# Patient Record
Sex: Female | Born: 1960 | Hispanic: No | Marital: Married | Smoking: Never smoker
Health system: Southern US, Community
[De-identification: ages and names within clinical notes are randomized; demographics above are authoritative.]

## PROBLEM LIST (undated history)

## (undated) HISTORY — PX: COLONOSCOPY: SHX174

## (undated) HISTORY — PX: EYE SURGERY: SHX253

---

## 2021-06-11 LAB — COLOGUARD: COLOGUARD: NEGATIVE

## 2022-01-16 ENCOUNTER — Ambulatory Visit (INDEPENDENT_AMBULATORY_CARE_PROVIDER_SITE_OTHER): Payer: BC Managed Care – PPO

## 2022-01-16 ENCOUNTER — Ambulatory Visit
Admission: EM | Admit: 2022-01-16 | Discharge: 2022-01-16 | Disposition: A | Discharge status: Home / Self Care | Payer: BC Managed Care – PPO

## 2022-01-16 ENCOUNTER — Other Ambulatory Visit: Payer: Self-pay

## 2022-01-16 DIAGNOSIS — S52502A Unspecified fracture of the lower end of left radius, initial encounter for closed fracture: Secondary | ICD-10-CM

## 2022-01-16 NOTE — ED Triage Notes (Signed)
Patient presents to Urgent Care with complaints falling on concrete and landing on her left wrist. Increase pain with movements.  ? ?Denies numbness.  ?

## 2022-01-16 NOTE — ED Provider Notes (Signed)
?[facility] ? ? ? ?CSN: [id number] ?Arrival date & time: 01/16/22  1901 ? ? ?  ? ?History   ?Chief Complaint ?Chief Complaint  ?Patient presents with  ? Fall  ? Wrist Injury  ? ? ?HPI ?Kaitlin Franklin is a 61 y.o. female presenting for left wrist pain and swelling following an accidental fall that happened today.  Patient says she slipped and fell on concrete, landing on her wrist.  Patient has swelling to the distal radial aspect of wrist.  She reports the area feels tingly.  Sometimes pain radiates up the forearm a little bit.  There is no associated bruising or lacerations/abrasions.  Patient is right-handed.  She denies any major injury of this extremity in the past.  She denies any other injury sustained in the accidental fall.  No other complaints. ? ?HPI ? ?History reviewed. No pertinent past medical history. ? ?There are no problems to display for this patient. ? ? ?Past Surgical History:  ?Procedure Laterality Date  ? COLONOSCOPY    ? EYE SURGERY    ? ? ?OB History   ?No obstetric history on file. ?  ? ? ? ?Home Medications   ? ?Prior to Admission medications   ?Medication Sig Start Date End Date Taking? Authorizing Provider  ?estradiol (ESTRACE) 0.1 MG/GM vaginal cream SMARTSIG:2 Gram(s) Vaginal Daily 10/06/21   [provider]  ? ? ?Family History ?History reviewed. No pertinent family history. ? ?Social History ?Social History  ? ?Tobacco Use  ? Smoking status: Never  ? Smokeless tobacco: Never  ?Vaping Use  ? Vaping Use: Never used  ?Substance Use Topics  ? Alcohol use: Yes  ? Drug use: Never  ? ? ? ?Allergies   ?Fentanyl, Benzalkonium chloride, Influenza vaccines, Mometasone, and Thimerosal ? ? ?Review of Systems ?Review of Systems  ?Musculoskeletal:  Positive for arthralgias and joint swelling.  ?Skin:  Negative for color change and wound.  ?Neurological:  Negative for weakness and numbness.  ? ? ?Physical Exam ?Triage Vital Signs ?ED Triage Vitals  ?Enc Vitals Group  ?   BP   ?    Pulse   ?   Resp   ?   Temp   ?   Temp src   ?   SpO2   ?   Weight   ?   Height   ?   Head Circumference   ?   Peak Flow   ?   Pain Score   ?   Pain Loc   ?   Pain Edu?   ?   Excl. in GC?   ? ?No data found. ? ?Updated Vital Signs ?BP (!) 149/112 (BP Location: Right Arm)   Pulse 83   Resp 16   SpO2 99%  ?   ? ?Physical Exam ?Vitals and nursing note reviewed.  ?Constitutional:   ?   General: She is not in acute distress. ?   Appearance: Normal appearance. She is not ill-appearing or toxic-appearing.  ?HENT:  ?   Head: Normocephalic and atraumatic.  ?Eyes:  ?   General: No scleral icterus.    ?   Right eye: No discharge.     ?   Left eye: No discharge.  ?   Conjunctiva/sclera: Conjunctivae normal.  ?Cardiovascular:  ?   Rate and Rhythm: Normal rate and regular rhythm.  ?   Pulses: Normal pulses.  ?Pulmonary:  ?   Effort: Pulmonary effort is normal. No respiratory distress.  ?Musculoskeletal:  ?  Left wrist: Swelling (distal radius) and tenderness (distal radius) present. No deformity or snuff box tenderness. Decreased range of motion. Normal pulse.  ?   Cervical back: Neck supple.  ?Skin: ?   General: Skin is dry.  ?Neurological:  ?   General: No focal deficit present.  ?   Mental Status: She is alert. Mental status is at baseline.  ?   Motor: No weakness.  ?   Coordination: Coordination normal.  ?   Gait: Gait normal.  ?Psychiatric:     ?   Mood and Affect: Mood normal.     ?   Behavior: Behavior normal.     ?   Thought Content: Thought content normal.  ? ? ? ?UC Treatments / Results  ?Labs ?(all labs ordered are listed, but only abnormal results are displayed) ?Labs Reviewed - No data to display ? ?EKG ? ? ?Radiology ?DG Wrist Complete Left ? ?Result Date: 01/16/2022 ?CLINICAL DATA:  Recent fall with left wrist pain, initial encounter EXAM: LEFT WRIST - COMPLETE 3+ VIEW COMPARISON:  None. FINDINGS: There is an undisplaced fracture of the distal left radial metaphysis. Carpal bones are within normal limits. Distal  ulna is unremarkable. IMPRESSION: Undisplaced distal left radial fracture. Electronically Signed   By: Alcide Clever M.D.   On: 01/16/2022 19:40   ? ?Procedures ?Procedures (including critical care time) ? ?Medications Ordered in UC ?Medications - No data to display ? ?Initial Impression / Assessment and Plan / UC Course  ?I have reviewed the triage vital signs and the nursing notes. ? ?Pertinent labs & imaging results that were available during my care of the patient were reviewed by me and considered in my medical decision making (see chart for details). ? ?61 year old female presenting for left wrist pain and swelling following an accidental fall that occurred today.  On exam she does have swelling over the distal radius and tenderness in this area as well.  Reduced range of motion in all directions due to pain and swelling.  Good pulses and sensation.  X-ray of wrist obtained.  Distal radius fracture, nondisplaced seen.  Reviewed result with patient.  Patient placed in thumb spica wrist brace/splint.  Reviewed RICE guidelines and advised her not to use affected extremity until cleared by Ortho.  Patient given information for orthopedics to follow-up with in the next week. ? ? ?Final Clinical Impressions(s) / UC Diagnoses  ? ?Final diagnoses:  ?Closed fracture of distal end of left radius, unspecified fracture morphology, initial encounter  ? ? ? ?Discharge Instructions   ? ?  ?-You have fractured your distal radius. ?- We have placed you in a splint that you can remove but you should leave on for 99% of the time.  Treat this as a cast. ?- You can take it off to ice your wrist but you should sleep in it.  Ibuprofen or Tylenol for pain relief.  You should not be moving your wrist until orthopedics clears you. ?- Make an appointment with them for next week or go to the walk-in clinic. ? ?You have a condition requiring you to follow up with Orthopedics so please call one of the following office for appointment:   ? ?[facility] ?[address], [place], [place] [address] ?Phone: [phone] ? ?[facility] ?[address] [place], [place] [address] ?Phone: [phone]  ? ? ? ? ?ED Prescriptions   ?None ?  ? ?PDMP not reviewed this encounter. ?  ?Shirlee Latch, PA-C ?01/16/22  1953 ? ?

## 2022-01-16 NOTE — Discharge Instructions (Addendum)
-  You have fractured your distal radius. ?- We have placed you in a splint that you can remove but you should leave on for 99% of the time.  Treat this as a cast. ?- You can take it off to ice your wrist but you should sleep in it.  Ibuprofen or Tylenol for pain relief.  You should not be moving your wrist until orthopedics clears you. ?- Make an appointment with them for next week or go to the walk-in clinic. ? ?You have a condition requiring you to follow up with Orthopedics so please call one of the following office for appointment:  ? ?[facility] ?[address] [place], [place] [address] ?Phone: [phone] ? ?[facility] ?[address], [place], [place] [address] ?Phone: [phone]  ?

## 2023-03-12 IMAGING — CR DG WRIST COMPLETE 3+V*L*
4 series · 4 of 4 positions shown · non-contrast
Comparison: None.

CLINICAL DATA: Recent fall with left wrist pain, initial encounter

EXAM:
LEFT WRIST - COMPLETE 3+ VIEW

[wrist pa]
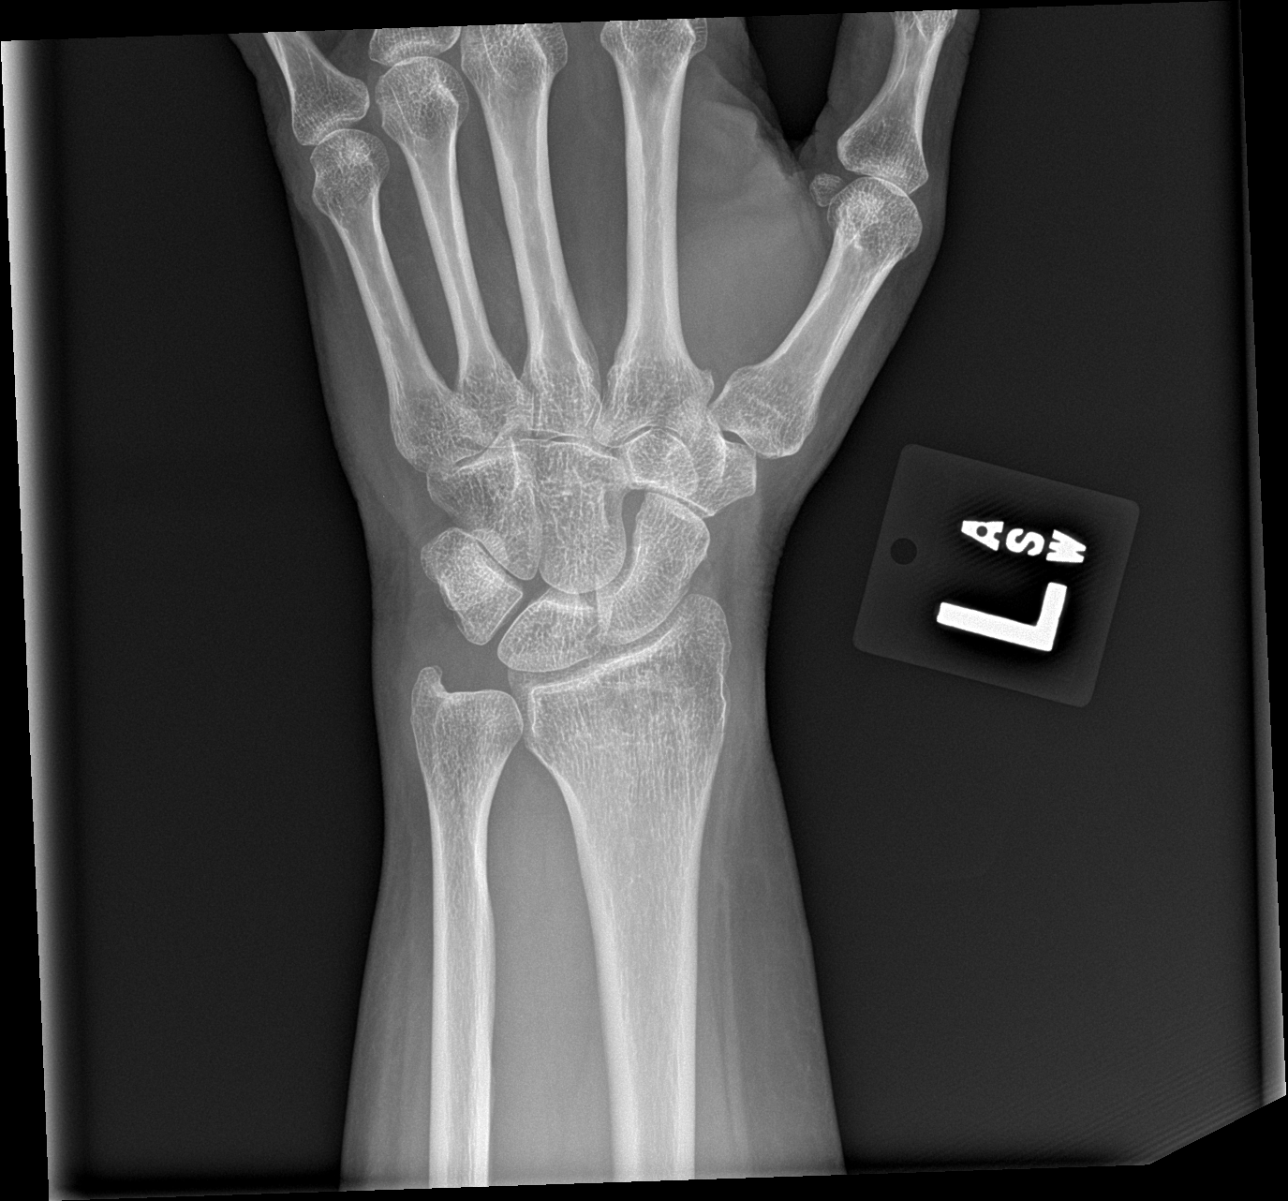

[wrist obl]
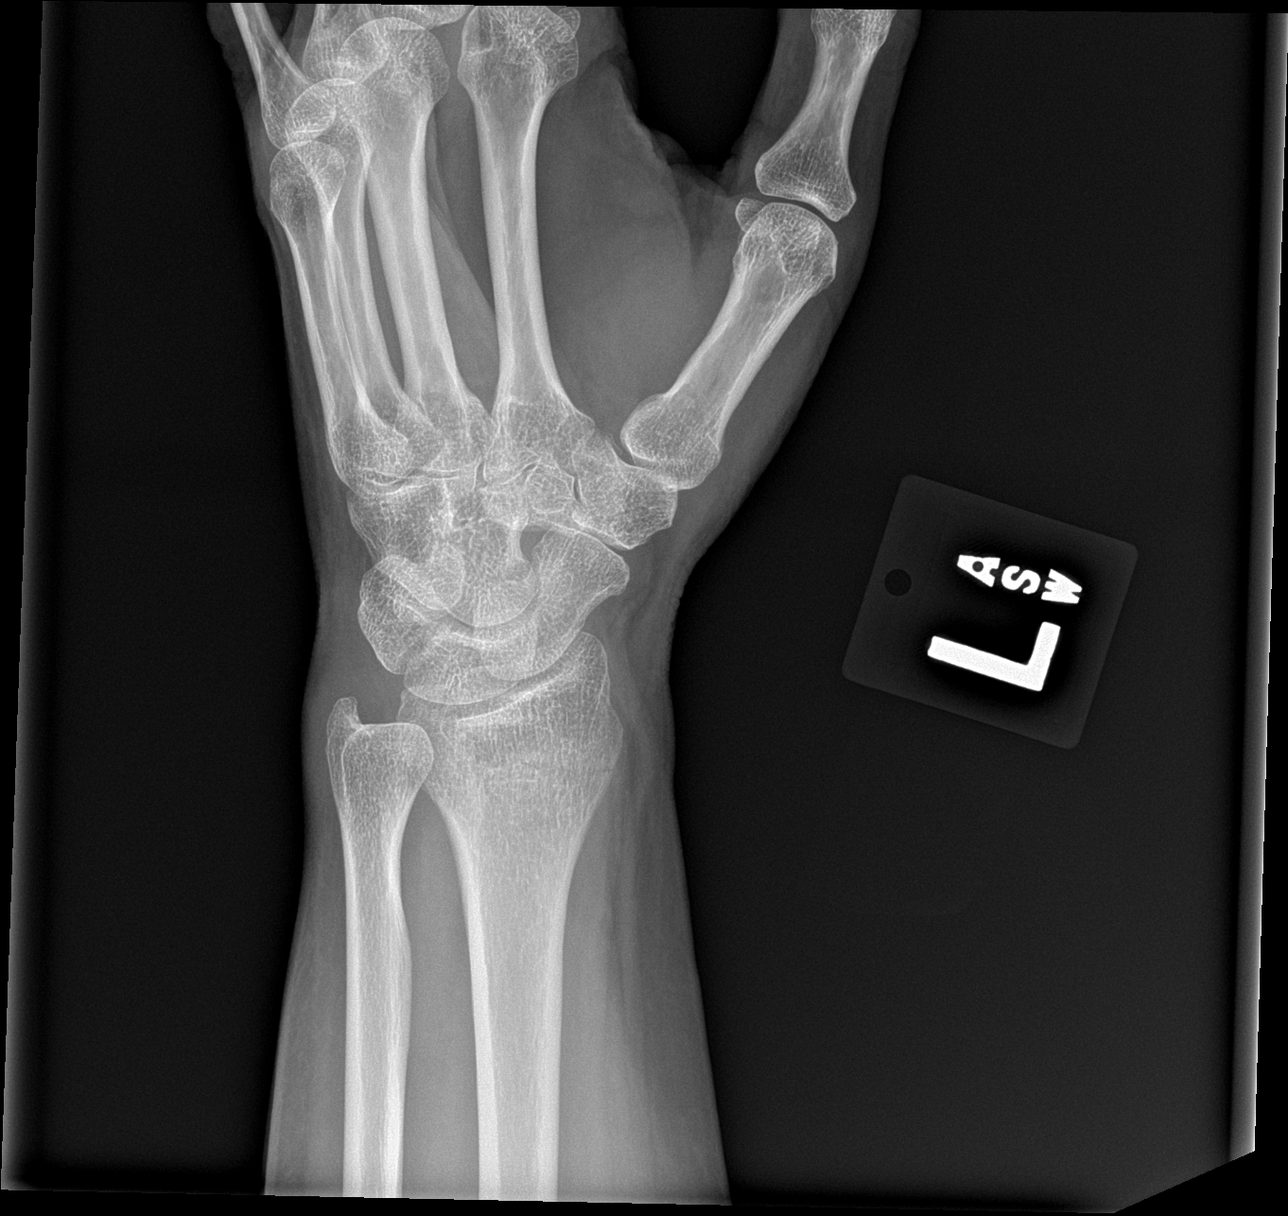

[wrist lat]
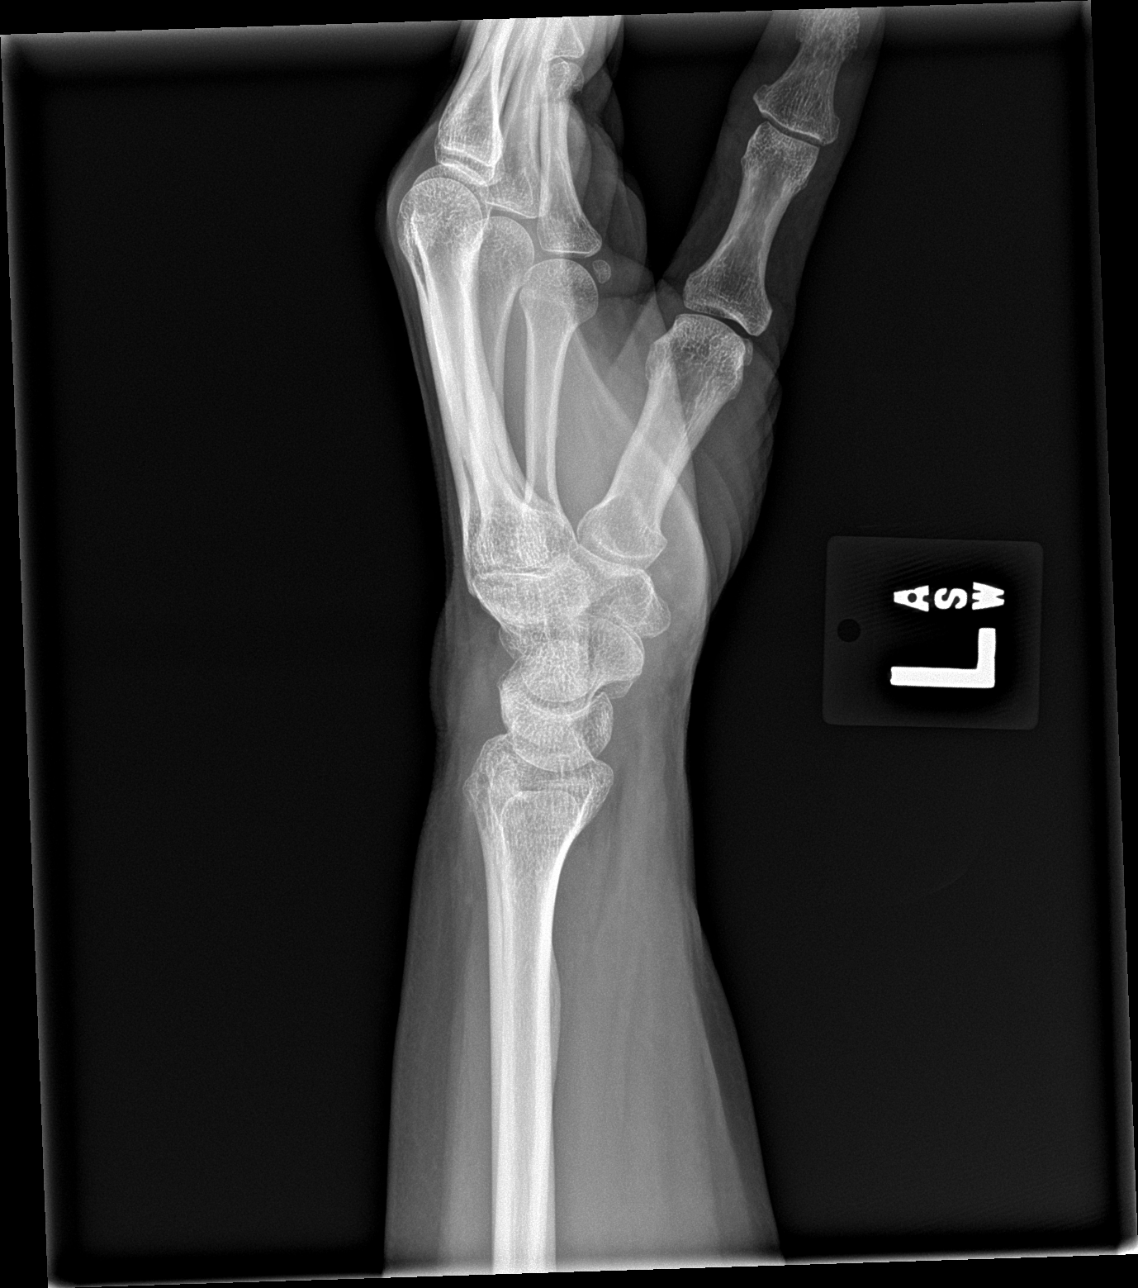

[wrist navicular]
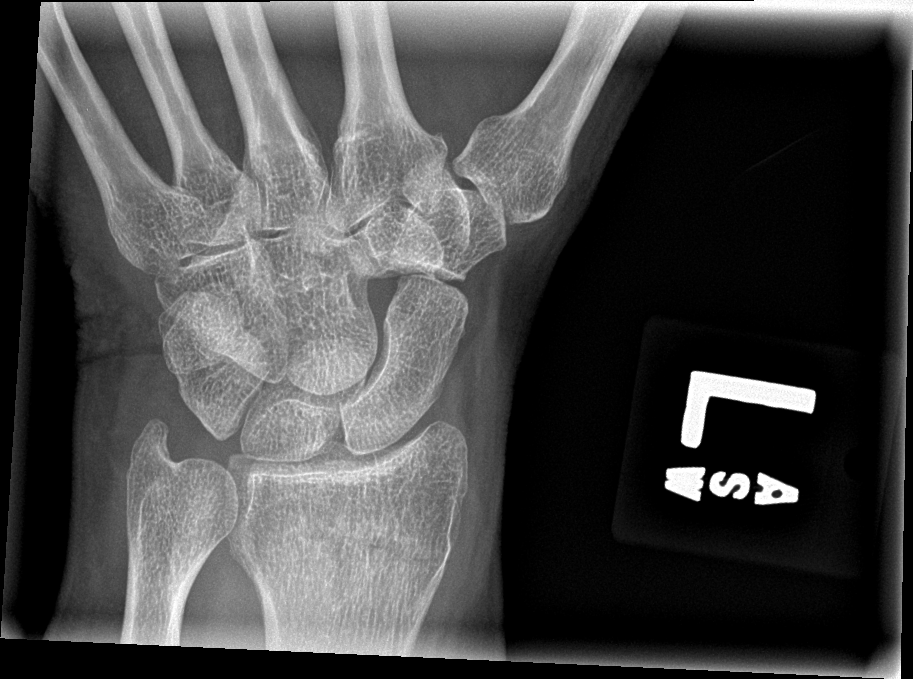

[4 of 4 positions shown; findings below may reference images not displayed]

FINDINGS: There is an undisplaced fracture of the distal left radial
metaphysis. Carpal bones are within normal limits. Distal ulna is
unremarkable.
IMPRESSION: Undisplaced distal left radial fracture.

## 2024-11-09 ENCOUNTER — Ambulatory Visit: Payer: Self-pay | Attending: Family

## 2024-11-09 DIAGNOSIS — M1711 Unilateral primary osteoarthritis, right knee: Secondary | ICD-10-CM | POA: Diagnosis present

## 2024-11-09 DIAGNOSIS — M25561 Pain in right knee: Secondary | ICD-10-CM | POA: Insufficient documentation

## 2024-11-09 DIAGNOSIS — M25661 Stiffness of right knee, not elsewhere classified: Secondary | ICD-10-CM | POA: Insufficient documentation

## 2024-11-09 NOTE — Progress Notes (Incomplete Revision)
 " OUTPATIENT PHYSICAL THERAPY LOWER EXTREMITY EVALUATION   Patient Name: Kaitlin Franklin MRN: [id number] DOB:[birth year], 64 y.o., female Today's Date: 11/09/2024  END OF SESSION:  PT End of Session - 11/09/24 1428     Visit Number 1    Number of Visits 17    Date for Recertification  01/04/25    PT Start Time 0950    PT Stop Time 1050    PT Time Calculation (min) 60 min    Activity Tolerance Patient tolerated treatment well    Behavior During Therapy [facility] for tasks assessed/performed          History reviewed. No pertinent past medical history. Past Surgical History:  Procedure Laterality Date   COLONOSCOPY     EYE SURGERY     There are no active problems to display for this patient.   PCP: Jaimie Devine, NP  REFERRING PROVIDER: Jaimie Devine, NP  REFERRING DIAG: Effusion, Right Knee   THERAPY DIAG:  Acute pain of right knee  Stiffness of right knee, not elsewhere classified  Primary osteoarthritis of right knee  Rationale for Evaluation and Treatment: Rehabilitation  ONSET DATE: 10/27/2024  SUBJECTIVE:   SUBJECTIVE STATEMENT: Pt presents to physical therapy with pain in her right knee secondary to a fall on her property roughly 2 months ago. Pt had originally sought a PT referral for her right hip and knee pain, but has experienced minimal pain in her hip since. 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 Pts main goal is to return to water aerobics class pain free, and to keep up with her grandson.   PERTINENT HISTORY: No pain in lower extremities before this incident, patient is active and regularly does yard work and physical activities. Pt reports that her right knee is initially stiff in the morning when she wakes up, with motion coming back in less than 30 minutes after moving around.  PAIN:  Are you having pain?  Pt not in current pain upon presenting to PT, when  squatting/running/jumping she experience 4/10 pain on the NPS. Does not take medicine to control pain, has considered ice for pain relief and to reduce inflammation.   PRECAUTIONS: None  RED FLAGS: None   WEIGHT BEARING RESTRICTIONS: No  FALLS:  Has patient fallen in last 6 months? Yes. Number of falls 1   PATIENT GOALS: Pts main goal is to return to water aerobics and HITT workouts and daily activities without pain   OBJECTIVE:  Note: Objective measures were completed at Evaluation unless otherwise noted.  DIAGNOSTIC FINDINGS: X-rays for hip and knee were performed showing narrowing joint spaces and presence of osteophytes.   PATIENT SURVEYS:  LEFS: 56/80   EDEMA:  Circumferential: Taken in sitting around midline of patella and popliteus: L 35cm, R 36cm   PALPATION: TTP at the origin and insertion of the peroneal tendons (lateral knee and lateral ankle)   LOWER EXTREMITY ROM:  Active / Passive ROM Right eval Left eval  Hip flexion    Hip extension    Hip abduction    Hip adduction    Hip internal rotation    Hip external rotation    Knee flexion 116/135 (pain) WFL  Knee extension 175/180 (pain) WFL   Ankle dorsiflexion [facility]  Ankle plantarflexion [facility]  Ankle inversion    Ankle eversion     (Blank rows = not tested)  Passive Accessory Motion: Left LE  normal in all PAMs Right LE: Patellar mobs tested in all planes, inferior and superior mobs are hypomobile (may be due to edema surrounding patella) Lateral tibio-femoral glide repeated improved pain rating Medial tibio-femoral glide repeated was more painful  Anterior to posterior tibio-femoral glide is hypomobile, improved with repeated Posterior to anterior tibio-femoral glide is normal  LOWER EXTREMITY MMT:  MMT Right eval Left eval  Hip flexion 4/5 4/5  Hip extension    Hip abduction    Hip adduction    Hip internal rotation    Hip external rotation    Knee flexion 5/5 (pain)  5/5  Knee  extension 5/5 5/5  Ankle dorsiflexion    Ankle plantarflexion    Ankle inversion    Ankle eversion     (Blank rows = not tested)  LOWER EXTREMITY SPECIAL TESTS:  McMurray's: Negative  GAIT: Mild antalgic gait on right side                                                                                                                              TREATMENT DATE: 11/09/2024  Initial evaluation performed   Manual therapy:  Right tibio-femoral distraction 3x30 seconds for pain modulation grade 3-4 in sitting Right tibio-femoral lateral glide 3x30 seconds at point of restriction grade 3-4 in supine  Therex: Hamstring curl in sitting with GTB 2x10 (given for HEP) Instructed how to perform self-distraction with ankle weight for pain modulation (given in HEP) 7lb ankle-weight (3 mins)   Access Code: [email or web address] URL: [email or web address] Date: 11/09/2024 Prepared by: Vernell Reges  Exercises - Seated Hamstring Curl with Anchored Resistance  - 1 x daily - 7 x weekly - 3 sets - 10 reps (Contains distraction HEP on print-out given)  PATIENT EDUCATION:  Education details: Pt given education on reducing pain and inflammation in narrowing joint spaces, as well as strengthening the involved muscles to reduce further wear and tear on joint surfaces.  Person educated: Patient Education method: Medical Illustrator Education comprehension: verbalized understanding  HOME EXERCISE PROGRAM: Self distraction with ankle weight or similar, 3x30s for pain reduction Knee flexion in sitting with GTB to promote hamstring strength   ASSESSMENT:  CLINICAL IMPRESSION: Patient is a 64 y.o. female who was seen today for physical therapy evaluation and treatment for joint effusion in the right knee. Pt has reduced range of motion and increased pain with knee flexion. Pt is an appropriate candidate for PT and will benefit from strengthening and ROM therex in order to promote a return to  community activities without pain. Pt symptoms improved from manual therapy given in treatment, was able to improve active knee flexion by 5 degrees with a pain level of 3/10 on the NPS. Pt should continue to improve from regular skilled therapy with a focus on manual therapy, strengthening, and endurance exercises.   OBJECTIVE IMPAIRMENTS: Abnormal gait, difficulty walking, decreased ROM, and decreased strength.   ACTIVITY LIMITATIONS: carrying, lifting, and sitting  PARTICIPATION LIMITATIONS: occupation and yard work  PERSONAL FACTORS: Time since onset of injury/illness/exacerbation are also affecting patient's functional outcome.   REHAB POTENTIAL: Good  CLINICAL DECISION MAKING: Stable/uncomplicated  EVALUATION COMPLEXITY: Low   GOALS: Goals reviewed with patient? Yes  SHORT TERM GOALS: Target date: 11/23/2024 Pt will be self-competent in her HEP, with minimal cueing needed by PT when reviewing her exercises at the time of treatment.  Baseline: 2 exercises given to patient  Goal status: INITIAL   LONG TERM GOALS: Target date: 12/07/2024  Pt will have a maximum pain level of 2/10 in her right knee on the NPS when performing a squat with proper form in order to return to daily tasks at her place of employment.  Baseline: 4/10 NPS currently reported  Goal status: INITIAL  2.  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  Baseline: 4/10 pain on right side, 120 degrees flexion, lacking 10 degrees  Goal status: INITIAL  3.  Pt will be able to return to working and group fitness classes with 2/10 pain on the NPS  Baseline: 4/10 NPS reported currently, not taking HIIT classes currently Goal status: INITIAL   PLAN:  PT FREQUENCY: 1-2x/week  PT DURATION: 8 weeks  PLANNED INTERVENTIONS: 97110-Therapeutic exercises, 97530-  Therapeutic activity, 97112- Neuromuscular re-education, 97535- Self Care, [id number]- Manual therapy, and Patient/Family education  PLAN FOR NEXT SESSION: Review HEP and pain level, begin progressing to more functional strength activities. Progress manual therapy and look at hamstring length and calf strength.  Consider looking at ankle joint, hip joint ROM and Lachman's next visit if needed   Juliene Levine, [place] 11/09/2024, 5:24 PM   Vernell Reges, PT, DPT, OCS  Student physical therapist under direct supervision of licensed physical therapist during the entirety of the session.  I have personally read, edited and approve of the note as written.  Vernell Reges, PT, DPT, OCS  "

## 2024-11-09 NOTE — Progress Notes (Signed)
 " OUTPATIENT PHYSICAL THERAPY LOWER EXTREMITY EVALUATION   Patient Name: Kaitlin Franklin MRN: [id number] DOB:[birth year], 64 y.o., female Today's Date: 11/09/2024  END OF SESSION:  PT End of Session - 11/09/24 1428     Visit Number 1    Number of Visits 17    Date for Recertification  01/04/25    PT Start Time 0950    PT Stop Time 1050    PT Time Calculation (min) 60 min    Activity Tolerance Patient tolerated treatment well    Behavior During Therapy [facility] for tasks assessed/performed          History reviewed. No pertinent past medical history. Past Surgical History:  Procedure Laterality Date   COLONOSCOPY     EYE SURGERY     There are no active problems to display for this patient.   PCP: Jaimie Devine, NP  REFERRING PROVIDER: Jaimie Devine, NP  REFERRING DIAG: Effusion, Right Knee   THERAPY DIAG:  Acute pain of right knee  Stiffness of right knee, not elsewhere classified  Primary osteoarthritis of right knee  Rationale for Evaluation and Treatment: Rehabilitation  ONSET DATE: 10/27/2024  SUBJECTIVE:   SUBJECTIVE STATEMENT: Pt presents to physical therapy with pain in her right knee secondary to a fall on her property roughly 2 months ago. Pt had originally sought a PT referral for her right hip and knee pain, but has experienced minimal pain in her hip since. 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 Pts main goal is to return to water aerobics class pain free, and to keep up with her grandson.   PERTINENT HISTORY: No pain in lower extremities before this incident, patient is active and regularly does yard work and physical activities. Pt reports that her right knee is initially stiff in the morning when she wakes up, with motion coming back in less than 30 minutes after moving around.  PAIN:  Are you having pain?  Pt not in current pain upon presenting to PT, when  squatting/running/jumping she experience 4/10 pain on the NPS. Does not take medicine to control pain, has considered ice for pain relief and to reduce inflammation.   PRECAUTIONS: None  RED FLAGS: None   WEIGHT BEARING RESTRICTIONS: No  FALLS:  Has patient fallen in last 6 months? Yes. Number of falls 1   PATIENT GOALS: Pts main goal is to return to water aerobics and HITT workouts and daily activities without pain   OBJECTIVE:  Note: Objective measures were completed at Evaluation unless otherwise noted.  DIAGNOSTIC FINDINGS: X-rays for hip and knee were performed showing narrowing joint spaces and presence of osteophytes.   PATIENT SURVEYS:  LEFS: 56/80   EDEMA:  Circumferential: Taken in sitting around midline of patella and popliteus: L 35cm, R 36cm   PALPATION: TTP at the origin and insertion of the peroneal tendons (lateral knee and lateral ankle)   LOWER EXTREMITY ROM:  Active / Passive ROM Right eval Left eval  Hip flexion    Hip extension    Hip abduction    Hip adduction    Hip internal rotation    Hip external rotation    Knee flexion 116/135 (pain) WFL  Knee extension 175/180 (pain) WFL   Ankle dorsiflexion [facility]  Ankle plantarflexion [facility]  Ankle inversion    Ankle eversion     (Blank rows = not tested)  Passive Accessory Motion: Left LE  normal in all PAMs Right LE: Patellar mobs tested in all planes, inferior and superior mobs are hypomobile (may be due to edema surrounding patella) Lateral tibio-femoral glide repeated improved pain rating Medial tibio-femoral glide repeated was more painful  Anterior to posterior tibio-femoral glide is hypomobile, improved with repeated Posterior to anterior tibio-femoral glide is normal  LOWER EXTREMITY MMT:  MMT Right eval Left eval  Hip flexion 4/5 4/5  Hip extension    Hip abduction    Hip adduction    Hip internal rotation    Hip external rotation    Knee flexion 5/5 (pain)  5/5  Knee  extension 5/5 5/5  Ankle dorsiflexion    Ankle plantarflexion    Ankle inversion    Ankle eversion     (Blank rows = not tested)  LOWER EXTREMITY SPECIAL TESTS:  McMurray's: Negative  GAIT: Mild antalgic gait on right side                                                                                                                              TREATMENT DATE: 11/09/2024  Initial evaluation performed   Manual therapy:  Right tibio-femoral distraction 3x30 seconds for pain modulation grade 3-4 in sitting Right tibio-femoral lateral glide 3x30 seconds at point of restriction grade 3-4 in supine  Therex: Hamstring curl in sitting with GTB 2x10 (given for HEP) Instructed how to perform self-distraction with ankle weight for pain modulation (given in HEP) 7lb ankle-weight (3 mins)   Access Code: [email or web address] URL: [email or web address] Date: 11/09/2024 Prepared by: Vernell Reges  Exercises - Seated Hamstring Curl with Anchored Resistance  - 1 x daily - 7 x weekly - 3 sets - 10 reps (Contains distraction HEP on print-out given)  PATIENT EDUCATION:  Education details: Pt given education on reducing pain and inflammation in narrowing joint spaces, as well as strengthening the involved muscles to reduce further wear and tear on joint surfaces.  Person educated: Patient Education method: Medical Illustrator Education comprehension: verbalized understanding  HOME EXERCISE PROGRAM: Self distraction with ankle weight or similar, 3x30s for pain reduction Knee flexion in sitting with GTB to promote hamstring strength   ASSESSMENT:  CLINICAL IMPRESSION: Patient is a 64 y.o. female who was seen today for physical therapy evaluation and treatment for joint effusion in the right knee. Pt has reduced range of motion and increased pain with knee flexion. Pt is an appropriate candidate for PT and will benefit from strengthening and ROM therex in order to promote a return to  community activities without pain. Pt symptoms improved from manual therapy given in treatment, was able to improve active knee flexion by 5 degrees with a pain level of 3/10 on the NPS. Pt should continue to improve from regular skilled therapy with a focus on manual therapy, strengthening, and endurance exercises.   OBJECTIVE IMPAIRMENTS: Abnormal gait, difficulty walking, decreased ROM, and decreased strength.   ACTIVITY LIMITATIONS: carrying, lifting, and sitting  PARTICIPATION LIMITATIONS: occupation and yard work  PERSONAL FACTORS: Time since onset of injury/illness/exacerbation are also affecting patient's functional outcome.   REHAB POTENTIAL: Good  CLINICAL DECISION MAKING: Stable/uncomplicated  EVALUATION COMPLEXITY: Low   GOALS: Goals reviewed with patient? Yes  SHORT TERM GOALS: Target date: 11/23/2024 Pt will be self-competent in her HEP, with minimal cueing needed by PT when reviewing her exercises at the time of treatment.  Baseline: 2 exercises given to patient  Goal status: INITIAL   LONG TERM GOALS: Target date: 12/07/2024  Pt will have a maximum pain level of 2/10 in her right knee on the NPS when performing a squat with proper form in order to return to daily tasks at her place of employment.  Baseline: 4/10 NPS currently reported  Goal status: INITIAL  2.  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  Baseline: 4/10 pain on right side, 120 degrees flexion, lacking 10 degrees  Goal status: INITIAL  3.  Pt will be able to return to working and group fitness classes with 2/10 pain on the NPS  Baseline: 4/10 NPS reported currently, not taking HIIT classes currently Goal status: INITIAL   PLAN:  PT FREQUENCY: 1-2x/week  PT DURATION: 8 weeks  PLANNED INTERVENTIONS: 97110-Therapeutic exercises, 97530-  Therapeutic activity, 97112- Neuromuscular re-education, 97535- Self Care, [id number]- Manual therapy, and Patient/Family education  PLAN FOR NEXT SESSION: Review HEP and pain level, begin progressing to more functional strength activities. Progress manual therapy and look at hamstring length and calf strength.    Juliene Levine, Student-PT 11/09/2024, 5:24 PM   Vernell Reges, PT, DPT, OCS  Student physical therapist under direct supervision of licensed physical therapist during the entirety of the session.  I have personally read, edited and approve of the note as written.  Vernell Reges, PT, DPT, OCS  "

## 2024-11-11 ENCOUNTER — Ambulatory Visit: Payer: Self-pay

## 2024-11-11 DIAGNOSIS — M25561 Pain in right knee: Secondary | ICD-10-CM | POA: Diagnosis not present

## 2024-11-11 DIAGNOSIS — M25661 Stiffness of right knee, not elsewhere classified: Secondary | ICD-10-CM

## 2024-11-11 DIAGNOSIS — M1711 Unilateral primary osteoarthritis, right knee: Secondary | ICD-10-CM

## 2024-11-11 NOTE — Therapy (Signed)
 OUTPATIENT PHYSICAL THERAPY LOWER EXTREMITY TREATMENT     Patient Name: Kaitlin Franklin MRN: [id number] DOB:Jun 02, 1961, 64 y.o., female Today's Date: 11/09/2024   END OF SESSION:   PT End of Session - 11/11/24 1428       Visit Number 2    Number of Visits 17     Date for Recertification  01/04/25     PT Start Time 0950     PT Stop Time 1050     PT Time Calculation (min) 60 min     Activity Tolerance Patient tolerated treatment well     Behavior During Therapy [facility] for tasks assessed/performed              History reviewed. No pertinent past medical history.          Past Surgical History:  Procedure Laterality Date   COLONOSCOPY       EYE SURGERY            There are no active problems to display for this patient.     PCP: Jaimie Devine, NP   REFERRING PROVIDER: Jaimie Devine, NP   REFERRING DIAG: Effusion, Right Knee    THERAPY DIAG:  Acute pain of right knee   Stiffness of right knee, not elsewhere classified   Primary osteoarthritis of right knee   Rationale for Evaluation and Treatment: Rehabilitation   ONSET DATE: 10/27/2024   SUBJECTIVE:    SUBJECTIVE STATEMENT: Pt presents to physical therapy with pain in her right knee secondary to a fall on her property roughly 2 months ago. Pt had originally sought a PT referral for her right hip and knee pain, but has experienced minimal pain in her hip since. 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 Pts main goal is to return to water aerobics class pain free, and to keep up with her grandson.    PERTINENT HISTORY: No pain in lower extremities before this incident, patient is active and regularly does yard work and physical activities. Pt reports that her right knee is initially stiff in the morning when she wakes up, with motion coming back in less than 30 minutes after moving around.  PAIN:  Are you having pain?  Pt not in current  pain upon presenting to PT, when squatting/running/jumping she experience 4/10 pain on the NPS. Does not take medicine to control pain, has considered ice for pain relief and to reduce inflammation.    PRECAUTIONS: None   RED FLAGS: None      WEIGHT BEARING RESTRICTIONS: No   FALLS:  Has patient fallen in last 6 months? Yes. Number of falls 1     PATIENT GOALS: Pts main goal is to return to water aerobics and HITT workouts and daily activities without pain    OBJECTIVE:  Note: Objective measures were completed at Evaluation unless otherwise noted.   DIAGNOSTIC FINDINGS: X-rays for hip and knee were performed showing narrowing joint spaces and presence of osteophytes.    PATIENT SURVEYS:  LEFS: 56/80    EDEMA:  Circumferential: Taken in sitting around midline of patella and popliteus: L 35cm, R 36cm    PALPATION: TTP at the origin and insertion of the peroneal tendons (lateral knee and lateral ankle)    LOWER EXTREMITY ROM:   Active / Passive ROM Right eval Left eval  Hip flexion      Hip extension      Hip abduction  Hip adduction      Hip internal rotation      Hip external rotation      Knee flexion 116/135 (pain) WFL  Knee extension 175/180 (pain) WFL   Ankle dorsiflexion [facility]  Ankle plantarflexion [facility] WFL  Ankle inversion      Ankle eversion       (Blank rows = not tested)   Passive Accessory Motion: Left LE normal in all PAMs Right LE: Patellar mobs tested in all planes, inferior and superior mobs are hypomobile (may be due to edema surrounding patella) Lateral tibio-femoral glide repeated improved pain rating Medial tibio-femoral glide repeated was more painful  Anterior to posterior tibio-femoral glide is hypomobile, improved with repeated Posterior to anterior tibio-femoral glide is normal   LOWER EXTREMITY MMT:   MMT Right eval Left eval  Hip flexion 4/5 4/5  Hip extension      Hip abduction      Hip adduction      Hip internal  rotation      Hip external rotation      Knee flexion 5/5 (pain)  5/5  Knee extension 5/5 5/5  Ankle dorsiflexion      Ankle plantarflexion      Ankle inversion      Ankle eversion       (Blank rows = not tested)   LOWER EXTREMITY SPECIAL TESTS:  McMurray's: Negative   GAIT: Mild antalgic gait on right side                                                                                                                              OUTPATIENT PHYSICAL THERAPY LE TREATMENT SESSION  TREATMENT DATE: 11/11/2024     SUBJECTIVE:    SUBJECTIVE STATEMENT: Pt has completed her prescribed HEP yesterday and reported it went well. Pt is excited to report she attended water aerobics yesterday and looks forward to work tomorrow. Reports some pain down the lateral side of her right LE when pulling her leg up to get into and out of her vehicle.   (pt arrived late to appointment, only 1 unit of skilled time was billed OBJECTIVE:   Manual Therapy: Right tibio-femoral distraction 3x30 seconds for pain modulation grade 3-4 in sitting Right tibio-femoral lateral glide 3x30 seconds at point of restriction grade 3-4 in supine  Therex: SAQ in supine with bolster under knees, 3lb ankle weight: 2x10 Supine adductor ball squeeze 2x10 with 5 second hold Sidelying hip abduction with 3lb ankle weight 2x10  TG squats 10 squats each in R LE SLS, both LE toe in and both LE toe out  Calf raises on first step 1x fatigue   There.act: Balance beam airex (shoes off) tandem walking 10x each forward and backward SLS on airex 3x30 each leg  Magnet fishing game x 3 minutes SLS each LE for dual-tasking balance control and functional reach     HOME EXERCISE PROGRAM: Access Code: [email or web address] URL: [email or web address]  Date: 11/11/2024 Prepared by: Vernell Reges  Exercises - Seated Hamstring Curl with Anchored Resistance  - 1 x daily - 7 x weekly - 3 sets - 10 reps - Sidelying Hip Abduction  - 1 x  daily - 7 x weekly - 3 sets - 10 reps - Supine Hip Adduction Isometric with Ball  - 1 x daily - 7 x weekly - 3 sets - 10 reps   ASSESSMENT:   CLINICAL IMPRESSION: Patient is a 64 y.o. female with R knee OA who was seen today for physical therapy treatment for joint effusion in the right knee. 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  Overall, she is improving since the injury.  Pt is an appropriate candidate for PT and will benefit from strengthening and ROM therex in order to promote a return to community activities without pain. Pt able to complete therapeutic exercises and activities today without report of increased knee pain.  SL balance activities were appropriately challenging.  Pt should continue to improve with regular skilled therapy focusing tx on manual therapy, strengthening, and endurance exercises.      GOALS: Goals reviewed with patient? Yes   SHORT TERM GOALS: Target date: 11/23/2024 Pt will be self-competent in her HEP, with minimal cueing needed by PT when reviewing her exercises at the time of treatment.  Baseline: 2 exercises given to patient  Goal status: INITIAL     LONG TERM GOALS: Target date: 01/04/2025   Pt will have a maximum pain level of 2/10 in her right knee on the NPS when performing a squat with proper form in order to return to daily tasks at her place of employment.  Baseline: 4/10 NPS currently reported  Goal status: INITIAL   2.  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  Baseline: 4/10 pain on right side, 120 degrees flexion, lacking 10 degrees  Goal status: INITIAL   3.  Pt will be able to return to working and group fitness classes with 2/10 pain on the NPS  Baseline: 4/10 NPS reported currently, not taking HIIT  classes currently Goal status: INITIAL     PLAN:   PT FREQUENCY: 1-2x/week   PT DURATION: 8 weeks   PLANNED INTERVENTIONS: 97110-Therapeutic exercises, 97530- Therapeutic activity, 97112- Neuromuscular re-education, 97535- Self Care, [id number]- Manual therapy, and Patient/Family education   PLAN FOR NEXT SESSION: Review HEP and pain level, begin progressing to more functional strength activities. Progress manual therapy and look at hamstring length and calf strength.  Consider looking at ankle joint, hip joint ROM and Lachman's next visit if needed     Juliene Levine, [place] 11/09/2024, 5:24 PM    Vernell Reges, PT, DPT, OCS   Student physical therapist under direct supervision of licensed physical therapist during the entirety of the session.  I have personally read, edited and approve of the note as written.  Vernell Reges, PT, DPT, OCS

## 2024-11-16 ENCOUNTER — Ambulatory Visit: Payer: Self-pay

## 2024-11-16 ENCOUNTER — Ambulatory Visit

## 2024-11-18 ENCOUNTER — Ambulatory Visit

## 2024-11-18 DIAGNOSIS — M1711 Unilateral primary osteoarthritis, right knee: Secondary | ICD-10-CM

## 2024-11-18 DIAGNOSIS — M25661 Stiffness of right knee, not elsewhere classified: Secondary | ICD-10-CM

## 2024-11-18 DIAGNOSIS — M25561 Pain in right knee: Secondary | ICD-10-CM | POA: Diagnosis not present

## 2024-11-23 ENCOUNTER — Ambulatory Visit

## 2024-11-25 ENCOUNTER — Ambulatory Visit

## 2024-11-25 DIAGNOSIS — M25661 Stiffness of right knee, not elsewhere classified: Secondary | ICD-10-CM

## 2024-11-25 DIAGNOSIS — M25561 Pain in right knee: Secondary | ICD-10-CM

## 2024-11-25 DIAGNOSIS — M1711 Unilateral primary osteoarthritis, right knee: Secondary | ICD-10-CM

## 2024-11-25 NOTE — Therapy (Addendum)
 "   OUTPATIENT PHYSICAL THERAPY LOWER EXTREMITY TREATMENT     Patient Name: Kaitlin Franklin MRN: [id number] DOB:1961/09/16, 64 y.o., female Today's Date: 11/09/2024   END OF SESSION:   PT End of Session - 11/25/24 1037     Visit Number 4    Number of Visits 17    Date for Recertification  01/04/25    PT Start Time 1040    PT Stop Time 1126    PT Time Calculation (min) 46 min    Activity Tolerance Patient tolerated treatment well    Behavior During Therapy [facility] for tasks assessed/performed             History reviewed. No pertinent past medical history.          Past Surgical History:  Procedure Laterality Date   COLONOSCOPY       EYE SURGERY            There are no active problems to display for this patient.     PCP: Jaimie Devine, NP   REFERRING PROVIDER: Jaimie Devine, NP   REFERRING DIAG: Effusion, Right Knee    THERAPY DIAG:  Acute pain of right knee   Stiffness of right knee, not elsewhere classified   Primary osteoarthritis of right knee   Rationale for Evaluation and Treatment: Rehabilitation   ONSET DATE: 10/27/2024   SUBJECTIVE:    SUBJECTIVE STATEMENT: Pt presents to physical therapy with pain in her right knee secondary to a fall on her property roughly 2 months ago. Pt had originally sought a PT referral for her right hip and knee pain, but has experienced minimal pain in her hip since. 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 Pts main goal is to return to water aerobics class pain free, and to keep up with her grandson.    PERTINENT HISTORY: No pain in lower extremities before this incident, patient is active and regularly does yard work and physical activities. Pt reports that her right knee is initially stiff in the morning when she wakes up, with motion coming back in less than 30 minutes after moving around.  PAIN:  Are you having pain?  Pt not in current pain  upon presenting to PT, when squatting/running/jumping she experience 4/10 pain on the NPS. Does not take medicine to control pain, has considered ice for pain relief and to reduce inflammation.    PRECAUTIONS: None   RED FLAGS: None      WEIGHT BEARING RESTRICTIONS: No   FALLS:  Has patient fallen in last 6 months? Yes. Number of falls 1     PATIENT GOALS: Pts main goal is to return to water aerobics and HITT workouts and daily activities without pain    OBJECTIVE:  Note: Objective measures were completed at Evaluation unless otherwise noted.   DIAGNOSTIC FINDINGS: X-rays for hip and knee were performed showing narrowing joint spaces and presence of osteophytes.    PATIENT SURVEYS:  LEFS: 56/80    EDEMA:  Circumferential: Taken in sitting around midline of patella and popliteus: L 35cm, R 36cm    PALPATION: TTP at the origin and insertion of the peroneal tendons (lateral knee and lateral ankle)    LOWER EXTREMITY ROM:   Active / Passive ROM Right eval Left eval  Hip flexion      Hip extension      Hip abduction      Hip adduction  Hip internal rotation      Hip external rotation      Knee flexion 116/135 (pain) WFL  Knee extension 175/180 (pain) WFL   Ankle dorsiflexion [facility]  Ankle plantarflexion [facility] WFL  Ankle inversion      Ankle eversion       (Blank rows = not tested)   Passive Accessory Motion: Left LE normal in all PAMs Right LE: Patellar mobs tested in all planes, inferior and superior mobs are hypomobile (may be due to edema surrounding patella) Lateral tibio-femoral glide repeated improved pain rating Medial tibio-femoral glide repeated was more painful  Anterior to posterior tibio-femoral glide is hypomobile, improved with repeated Posterior to anterior tibio-femoral glide is normal   LOWER EXTREMITY MMT:   MMT Right eval Left eval  Hip flexion 4/5 4/5  Hip extension      Hip abduction      Hip adduction      Hip internal rotation       Hip external rotation      Knee flexion 5/5 (pain)  5/5  Knee extension 5/5 5/5  Ankle dorsiflexion      Ankle plantarflexion      Ankle inversion      Ankle eversion       (Blank rows = not tested)   LOWER EXTREMITY SPECIAL TESTS:  McMurray's: Negative   GAIT: Mild antalgic gait on right side                                                                                                                              OUTPATIENT PHYSICAL THERAPY LE TREATMENT SESSION  TREATMENT DATE: 11/25/2024    Knee Goni Measurements today: 140 flexion, -3 degrees extension  SUBJECTIVE STATEMENT: Pt has been doing her HEP and has noticed improvements in her form and muscular control when completing them. Has not been back to water aerobics due to the weather. Pt continues to be active around the house but did accidentally snag her leg on a cinderblock when walking which caused some lingering pain this week.   OBJECTIVE:  Pt felt dizzy and light-headed during PT and c/o posterior occipital pain. Blood pressure 180/100, spO2 100, HR 90. Rechecked BP at 171/97. Pt reports getting over a stomach bug over the weekend(vomit/diarrhea)  and did not eat breakfast today before PT. Rechecked BP at 173/98 at end of session. Ending session at this point. Pt denies any N/T, vision and speech are normal, has normal balance/gait.  Instructed to re-check BP at home and education to go to urgent care/ER for medical attention if BP stays elevated or rises.   Therex:  SLR 2x20 3lb ankle weight bilat.  SAQ in supine with bolster under knees, 3lb ankle weight: 3x10 Supine adductor ball squeeze 3x10 with 5 second hold, added bridging today with this  Sidelying hip abduction with 3lb ankle weight 2x10  TG squats 10 squats each in R LE SLS, both LE  toe in and both LE toe out 2x8lb DB (no DB for SLS) Calf raises on first step 1x fatigue - not today Stool scoots no resistance 6 ladder laps - not today   There.act: Side  lunges 2x10 bilat. No weight today  SLS on airex 3x30 each leg     HOME EXERCISE PROGRAM: Access Code: [email or web address] URL: [email or web address] Date: 11/11/2024 Prepared by: Vernell Reges  Exercises - Seated Hamstring Curl with Anchored Resistance  - 1 x daily - 7 x weekly - 3 sets - 10 reps - Sidelying Hip Abduction  - 1 x daily - 7 x weekly - 3 sets - 10 reps - Supine Hip Adduction Isometric with Ball  - 1 x daily - 7 x weekly - 3 sets - 10 reps   ASSESSMENT:   CLINICAL IMPRESSION: Patient is a 64 y.o. female with R knee OA who was seen today for physical therapy treatment for joint effusion in the right knee.  Pt is an appropriate candidate for PT and will benefit from strengthening and ROM therex in order to promote a return to community activities without pain. Pt able to complete therapeutic exercises and activities today without report of increased knee pain.  SL balance activities were appropriately challenging.  Pt should continue to improve with regular skilled therapy focusing tx on manual therapy, strengthening, and endurance exercises. Pt knee measurements have shown great improvement, focus on strength and balance therex moving forward. Ended session early today due to c/o light-headedness/dizziness and elevated BP(BP measure: 180/100, 90bpm, SpO2 100). Instructed to go to ER if BP does not improve or rises later today.      GOALS: Goals reviewed with patient? Yes   SHORT TERM GOALS: Target date: 11/23/2024 Pt will be self-competent in her HEP, with minimal cueing needed by PT when reviewing her exercises at the time of treatment.  Baseline: 2 exercises given to patient  Goal status: INITIAL     LONG TERM GOALS: Target date: 01/04/2025   Pt will have a maximum pain level of 2/10 in her right knee on the NPS when performing a squat with proper form in order to return to daily tasks at her place of employment.  Baseline: 4/10 NPS currently reported  Goal status:  INITIAL   2.  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  Baseline: 4/10 pain on right side, 120 degrees flexion, lacking 10 degrees  Goal status: INITIAL   3.  Pt will be able to return to working and group fitness classes with 2/10 pain on the NPS  Baseline: 4/10 NPS reported currently, not taking HIIT classes currently Goal status: INITIAL     PLAN:   PT FREQUENCY: 1-2x/week   PT DURATION: 8 weeks   PLANNED INTERVENTIONS: 97110-Therapeutic exercises, 97530- Therapeutic activity, 97112- Neuromuscular re-education, 97535- Self Care, [id number]- Manual therapy, and Patient/Family education   PLAN FOR NEXT SESSION: Review HEP and pain level, begin progressing to more functional strength activities. Progress manual therapy and look at hamstring length and calf strength.  Consider looking at ankle joint, hip joint ROM and Lachman's next visit if needed     Juliene Levine, [place] 11/09/2024, 5:24 PM    Vernell Reges, PT, DPT, OCS   Student physical therapist under direct supervision of licensed physical therapist during the entirety of the session.  I have personally read, edited and approve of the note as written.  Vernell Reges, PT, DPT, OCS      "

## 2024-11-30 ENCOUNTER — Ambulatory Visit

## 2024-12-02 ENCOUNTER — Ambulatory Visit

## 2024-12-07 ENCOUNTER — Ambulatory Visit

## 2024-12-09 ENCOUNTER — Ambulatory Visit

## 2024-12-14 ENCOUNTER — Ambulatory Visit

## 2024-12-16 ENCOUNTER — Ambulatory Visit

## 2024-12-21 ENCOUNTER — Ambulatory Visit

## 2024-12-23 ENCOUNTER — Ambulatory Visit
